# Patient Record
Sex: Female | Born: 1945 | Race: Asian | Hispanic: No | Smoking: Never smoker
Health system: Southern US, Community
[De-identification: ages and names within clinical notes are randomized; demographics above are authoritative.]

---

## 2002-09-18 ENCOUNTER — Other Ambulatory Visit: Admission: RE | Admit: 2002-09-18 | Discharge: 2002-09-18 | Payer: Self-pay | Admitting: Obstetrics and Gynecology

## 2002-10-10 ENCOUNTER — Encounter: Payer: Self-pay | Admitting: Urology

## 2002-10-10 ENCOUNTER — Encounter: Admission: RE | Admit: 2002-10-10 | Discharge: 2002-10-10 | Payer: Self-pay | Admitting: Urology

## 2005-09-04 ENCOUNTER — Other Ambulatory Visit: Admission: RE | Admit: 2005-09-04 | Discharge: 2005-09-04 | Payer: Self-pay | Admitting: Obstetrics and Gynecology

## 2014-12-16 DIAGNOSIS — I83811 Varicose veins of right lower extremities with pain: Secondary | ICD-10-CM | POA: Diagnosis not present

## 2014-12-16 DIAGNOSIS — Z09 Encounter for follow-up examination after completed treatment for conditions other than malignant neoplasm: Secondary | ICD-10-CM | POA: Diagnosis not present

## 2014-12-16 DIAGNOSIS — Z9889 Other specified postprocedural states: Secondary | ICD-10-CM | POA: Diagnosis not present

## 2015-02-11 DIAGNOSIS — K589 Irritable bowel syndrome without diarrhea: Secondary | ICD-10-CM | POA: Diagnosis not present

## 2015-02-11 DIAGNOSIS — J309 Allergic rhinitis, unspecified: Secondary | ICD-10-CM | POA: Diagnosis not present

## 2015-02-11 DIAGNOSIS — M159 Polyosteoarthritis, unspecified: Secondary | ICD-10-CM | POA: Diagnosis not present

## 2015-02-11 DIAGNOSIS — J209 Acute bronchitis, unspecified: Secondary | ICD-10-CM | POA: Diagnosis not present

## 2015-02-11 DIAGNOSIS — K5900 Constipation, unspecified: Secondary | ICD-10-CM | POA: Diagnosis not present

## 2015-03-17 DIAGNOSIS — I83891 Varicose veins of right lower extremities with other complications: Secondary | ICD-10-CM | POA: Diagnosis not present

## 2015-05-21 DIAGNOSIS — I872 Venous insufficiency (chronic) (peripheral): Secondary | ICD-10-CM | POA: Diagnosis not present

## 2015-07-02 DIAGNOSIS — R600 Localized edema: Secondary | ICD-10-CM | POA: Diagnosis not present

## 2015-09-01 DIAGNOSIS — G2581 Restless legs syndrome: Secondary | ICD-10-CM | POA: Diagnosis not present

## 2015-09-01 DIAGNOSIS — I10 Essential (primary) hypertension: Secondary | ICD-10-CM | POA: Diagnosis not present

## 2015-09-01 DIAGNOSIS — E785 Hyperlipidemia, unspecified: Secondary | ICD-10-CM | POA: Diagnosis not present

## 2015-09-01 DIAGNOSIS — M7989 Other specified soft tissue disorders: Secondary | ICD-10-CM | POA: Diagnosis not present

## 2015-11-12 DIAGNOSIS — J209 Acute bronchitis, unspecified: Secondary | ICD-10-CM | POA: Diagnosis not present

## 2015-11-22 DIAGNOSIS — J209 Acute bronchitis, unspecified: Secondary | ICD-10-CM | POA: Diagnosis not present

## 2015-12-08 DIAGNOSIS — Z0000 Encounter for general adult medical examination without abnormal findings: Secondary | ICD-10-CM | POA: Diagnosis not present

## 2015-12-08 DIAGNOSIS — Z23 Encounter for immunization: Secondary | ICD-10-CM | POA: Diagnosis not present

## 2015-12-08 DIAGNOSIS — E7800 Pure hypercholesterolemia, unspecified: Secondary | ICD-10-CM | POA: Diagnosis not present

## 2015-12-08 DIAGNOSIS — I10 Essential (primary) hypertension: Secondary | ICD-10-CM | POA: Diagnosis not present

## 2015-12-08 DIAGNOSIS — Z1389 Encounter for screening for other disorder: Secondary | ICD-10-CM | POA: Diagnosis not present

## 2015-12-08 DIAGNOSIS — G2581 Restless legs syndrome: Secondary | ICD-10-CM | POA: Diagnosis not present

## 2016-02-10 DIAGNOSIS — J302 Other seasonal allergic rhinitis: Secondary | ICD-10-CM | POA: Diagnosis not present

## 2016-02-25 DIAGNOSIS — R1310 Dysphagia, unspecified: Secondary | ICD-10-CM | POA: Diagnosis not present

## 2016-03-16 DIAGNOSIS — R1310 Dysphagia, unspecified: Secondary | ICD-10-CM | POA: Diagnosis not present

## 2016-03-20 DIAGNOSIS — R1310 Dysphagia, unspecified: Secondary | ICD-10-CM | POA: Diagnosis not present

## 2016-03-20 DIAGNOSIS — B9681 Helicobacter pylori [H. pylori] as the cause of diseases classified elsewhere: Secondary | ICD-10-CM | POA: Diagnosis not present

## 2016-03-20 DIAGNOSIS — K2950 Unspecified chronic gastritis without bleeding: Secondary | ICD-10-CM | POA: Diagnosis not present

## 2016-03-20 DIAGNOSIS — K253 Acute gastric ulcer without hemorrhage or perforation: Secondary | ICD-10-CM | POA: Diagnosis not present

## 2016-03-20 DIAGNOSIS — K2960 Other gastritis without bleeding: Secondary | ICD-10-CM | POA: Diagnosis not present

## 2016-03-20 DIAGNOSIS — Z791 Long term (current) use of non-steroidal anti-inflammatories (NSAID): Secondary | ICD-10-CM | POA: Diagnosis not present

## 2016-03-20 DIAGNOSIS — K219 Gastro-esophageal reflux disease without esophagitis: Secondary | ICD-10-CM | POA: Diagnosis not present

## 2016-03-24 DIAGNOSIS — R1310 Dysphagia, unspecified: Secondary | ICD-10-CM | POA: Diagnosis not present

## 2016-03-24 DIAGNOSIS — J302 Other seasonal allergic rhinitis: Secondary | ICD-10-CM | POA: Diagnosis not present

## 2016-04-20 DIAGNOSIS — K259 Gastric ulcer, unspecified as acute or chronic, without hemorrhage or perforation: Secondary | ICD-10-CM | POA: Diagnosis not present

## 2016-04-20 DIAGNOSIS — A048 Other specified bacterial intestinal infections: Secondary | ICD-10-CM | POA: Diagnosis not present

## 2016-04-20 DIAGNOSIS — R130 Aphagia: Secondary | ICD-10-CM | POA: Diagnosis not present

## 2016-06-30 DIAGNOSIS — K219 Gastro-esophageal reflux disease without esophagitis: Secondary | ICD-10-CM | POA: Diagnosis not present

## 2016-06-30 DIAGNOSIS — I10 Essential (primary) hypertension: Secondary | ICD-10-CM | POA: Diagnosis not present

## 2016-08-22 DIAGNOSIS — Z1231 Encounter for screening mammogram for malignant neoplasm of breast: Secondary | ICD-10-CM | POA: Diagnosis not present

## 2016-10-04 DIAGNOSIS — J329 Chronic sinusitis, unspecified: Secondary | ICD-10-CM | POA: Diagnosis not present

## 2016-10-04 DIAGNOSIS — J309 Allergic rhinitis, unspecified: Secondary | ICD-10-CM | POA: Diagnosis not present

## 2016-10-04 DIAGNOSIS — R070 Pain in throat: Secondary | ICD-10-CM | POA: Diagnosis not present

## 2016-10-04 DIAGNOSIS — R05 Cough: Secondary | ICD-10-CM | POA: Diagnosis not present

## 2016-10-09 DIAGNOSIS — J209 Acute bronchitis, unspecified: Secondary | ICD-10-CM | POA: Diagnosis not present

## 2016-11-08 DIAGNOSIS — I10 Essential (primary) hypertension: Secondary | ICD-10-CM | POA: Diagnosis not present

## 2016-11-08 DIAGNOSIS — R05 Cough: Secondary | ICD-10-CM | POA: Diagnosis not present

## 2016-11-13 DIAGNOSIS — R918 Other nonspecific abnormal finding of lung field: Secondary | ICD-10-CM | POA: Diagnosis not present

## 2016-11-13 DIAGNOSIS — R911 Solitary pulmonary nodule: Secondary | ICD-10-CM | POA: Diagnosis not present

## 2016-11-14 ENCOUNTER — Other Ambulatory Visit (HOSPITAL_COMMUNITY): Payer: Self-pay | Admitting: Internal Medicine

## 2016-11-14 DIAGNOSIS — D381 Neoplasm of uncertain behavior of trachea, bronchus and lung: Secondary | ICD-10-CM

## 2016-11-17 ENCOUNTER — Ambulatory Visit (INDEPENDENT_AMBULATORY_CARE_PROVIDER_SITE_OTHER): Payer: Medicare Other | Admitting: Internal Medicine

## 2016-11-17 ENCOUNTER — Encounter: Payer: Self-pay | Admitting: Internal Medicine

## 2016-11-17 VITALS — BP 122/66 | HR 69 | Ht 61.0 in | Wt 139.0 lb

## 2016-11-17 DIAGNOSIS — R911 Solitary pulmonary nodule: Secondary | ICD-10-CM
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
## 2016-11-17 NOTE — Patient Instructions (Addendum)
I will call you with the PET scan results - if it is suggestive of a tumor it needs to be removed and I will refer you to a thoracic surgeon - if not we will set up follow up   If there is a cxr at Dr Marguerita Beards office I would be happy to review it when you return.
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
## 2016-11-20 DIAGNOSIS — R490 Dysphonia: Secondary | ICD-10-CM | POA: Diagnosis not present

## 2016-11-20 DIAGNOSIS — R05 Cough: Secondary | ICD-10-CM | POA: Diagnosis not present

## 2016-11-20 DIAGNOSIS — J383 Other diseases of vocal cords: Secondary | ICD-10-CM | POA: Diagnosis not present

## 2016-11-23 ENCOUNTER — Ambulatory Visit (HOSPITAL_COMMUNITY)
Admission: RE | Admit: 2016-11-23 | Discharge: 2016-11-23 | Disposition: A | Discharge status: Home / Self Care | Payer: Medicare Other | Source: Ambulatory Visit | Attending: Internal Medicine | Admitting: Internal Medicine

## 2016-11-23 DIAGNOSIS — D381 Neoplasm of uncertain behavior of trachea, bronchus and lung: Secondary | ICD-10-CM | POA: Diagnosis not present

## 2016-11-23 DIAGNOSIS — R911 Solitary pulmonary nodule: Secondary | ICD-10-CM | POA: Diagnosis not present

## 2016-11-23 LAB — GLUCOSE, CAPILLARY: Glucose-Capillary: 96 mg/dL (ref 65–99)

## 2016-11-23 MED ORDER — FLUDEOXYGLUCOSE F - 18 (FDG) INJECTION
8.1000 | Freq: Once | INTRAVENOUS | Status: AC | PRN
  Administered 2016-11-23: 8.1 via INTRAVENOUS

## 2016-12-14 ENCOUNTER — Telehealth: Payer: Self-pay | Admitting: Internal Medicine

## 2016-12-14 NOTE — Telephone Encounter (Signed)
LMTCB

## 2016-12-15 NOTE — Telephone Encounter (Signed)
Spoke with pt's son, Gwyndolyn Saxon. He is wanting a copy of the pt's PET scan sent to her PCP and her ENT at [facility]. After doing some research, it looks like we were not the ones who ordered this PET scan. Pt's son states that her PCP did. Advised him that he would need to contact pt's PCP about this matter. Nothing further was needed.

## 2016-12-28 DIAGNOSIS — R109 Unspecified abdominal pain: Secondary | ICD-10-CM | POA: Diagnosis not present

## 2016-12-28 DIAGNOSIS — R911 Solitary pulmonary nodule: Secondary | ICD-10-CM | POA: Diagnosis not present

## 2017-01-05 DIAGNOSIS — R911 Solitary pulmonary nodule: Secondary | ICD-10-CM | POA: Diagnosis not present

## 2017-01-05 DIAGNOSIS — R918 Other nonspecific abnormal finding of lung field: Secondary | ICD-10-CM | POA: Diagnosis not present

## 2017-01-05 DIAGNOSIS — Z888 Allergy status to other drugs, medicaments and biological substances status: Secondary | ICD-10-CM | POA: Diagnosis not present

## 2017-01-05 DIAGNOSIS — Z79899 Other long term (current) drug therapy: Secondary | ICD-10-CM | POA: Diagnosis not present

## 2017-01-05 DIAGNOSIS — J42 Unspecified chronic bronchitis: Secondary | ICD-10-CM | POA: Diagnosis not present

## 2017-01-05 DIAGNOSIS — I10 Essential (primary) hypertension: Secondary | ICD-10-CM | POA: Diagnosis not present

## 2017-01-12 DIAGNOSIS — R112 Nausea with vomiting, unspecified: Secondary | ICD-10-CM | POA: Diagnosis not present

## 2017-01-18 DIAGNOSIS — K224 Dyskinesia of esophagus: Secondary | ICD-10-CM | POA: Diagnosis not present

## 2017-01-18 DIAGNOSIS — R1013 Epigastric pain: Secondary | ICD-10-CM | POA: Diagnosis not present

## 2017-01-18 DIAGNOSIS — A048 Other specified bacterial intestinal infections: Secondary | ICD-10-CM | POA: Diagnosis not present

## 2017-01-18 DIAGNOSIS — Z8711 Personal history of peptic ulcer disease: Secondary | ICD-10-CM | POA: Diagnosis not present

## 2017-01-18 DIAGNOSIS — R1110 Vomiting, unspecified: Secondary | ICD-10-CM | POA: Diagnosis not present

## 2017-01-23 DIAGNOSIS — N200 Calculus of kidney: Secondary | ICD-10-CM | POA: Diagnosis not present

## 2017-01-23 DIAGNOSIS — R1013 Epigastric pain: Secondary | ICD-10-CM | POA: Diagnosis not present

## 2017-01-23 DIAGNOSIS — K8020 Calculus of gallbladder without cholecystitis without obstruction: Secondary | ICD-10-CM | POA: Diagnosis not present

## 2017-01-23 DIAGNOSIS — A048 Other specified bacterial intestinal infections: Secondary | ICD-10-CM | POA: Diagnosis not present

## 2017-02-08 DIAGNOSIS — I2510 Atherosclerotic heart disease of native coronary artery without angina pectoris: Secondary | ICD-10-CM | POA: Diagnosis not present

## 2017-02-08 DIAGNOSIS — R590 Localized enlarged lymph nodes: Secondary | ICD-10-CM | POA: Diagnosis not present

## 2017-02-08 DIAGNOSIS — R911 Solitary pulmonary nodule: Secondary | ICD-10-CM | POA: Diagnosis not present

## 2017-02-27 DIAGNOSIS — J029 Acute pharyngitis, unspecified: Secondary | ICD-10-CM | POA: Diagnosis not present

## 2017-02-27 DIAGNOSIS — K8020 Calculus of gallbladder without cholecystitis without obstruction: Secondary | ICD-10-CM | POA: Diagnosis not present

## 2017-02-27 DIAGNOSIS — J310 Chronic rhinitis: Secondary | ICD-10-CM | POA: Diagnosis not present

## 2017-04-23 DIAGNOSIS — R1310 Dysphagia, unspecified: Secondary | ICD-10-CM | POA: Diagnosis not present

## 2017-04-23 DIAGNOSIS — E7800 Pure hypercholesterolemia, unspecified: Secondary | ICD-10-CM | POA: Diagnosis not present

## 2017-04-23 DIAGNOSIS — K8020 Calculus of gallbladder without cholecystitis without obstruction: Secondary | ICD-10-CM | POA: Diagnosis not present

## 2017-04-23 DIAGNOSIS — R918 Other nonspecific abnormal finding of lung field: Secondary | ICD-10-CM | POA: Diagnosis not present

## 2017-04-23 DIAGNOSIS — Z79899 Other long term (current) drug therapy: Secondary | ICD-10-CM | POA: Diagnosis not present

## 2017-04-23 DIAGNOSIS — I10 Essential (primary) hypertension: Secondary | ICD-10-CM | POA: Diagnosis not present

## 2017-05-09 DIAGNOSIS — R1310 Dysphagia, unspecified: Secondary | ICD-10-CM | POA: Diagnosis not present

## 2017-05-09 DIAGNOSIS — K219 Gastro-esophageal reflux disease without esophagitis: Secondary | ICD-10-CM | POA: Diagnosis not present

## 2017-05-14 DIAGNOSIS — R911 Solitary pulmonary nodule: Secondary | ICD-10-CM | POA: Diagnosis not present

## 2017-05-22 DIAGNOSIS — C3411 Malignant neoplasm of upper lobe, right bronchus or lung: Secondary | ICD-10-CM | POA: Diagnosis not present

## 2017-05-22 DIAGNOSIS — R911 Solitary pulmonary nodule: Secondary | ICD-10-CM | POA: Diagnosis not present

## 2017-05-24 DIAGNOSIS — R911 Solitary pulmonary nodule: Secondary | ICD-10-CM | POA: Diagnosis not present

## 2017-05-24 DIAGNOSIS — C801 Malignant (primary) neoplasm, unspecified: Secondary | ICD-10-CM | POA: Diagnosis not present

## 2017-05-28 DIAGNOSIS — Z7722 Contact with and (suspected) exposure to environmental tobacco smoke (acute) (chronic): Secondary | ICD-10-CM | POA: Diagnosis not present

## 2017-05-28 DIAGNOSIS — E7800 Pure hypercholesterolemia, unspecified: Secondary | ICD-10-CM | POA: Diagnosis not present

## 2017-05-28 DIAGNOSIS — C3490 Malignant neoplasm of unspecified part of unspecified bronchus or lung: Secondary | ICD-10-CM | POA: Diagnosis not present

## 2017-05-28 DIAGNOSIS — K210 Gastro-esophageal reflux disease with esophagitis: Secondary | ICD-10-CM | POA: Diagnosis not present

## 2017-05-28 DIAGNOSIS — Z886 Allergy status to analgesic agent status: Secondary | ICD-10-CM | POA: Diagnosis not present

## 2017-05-28 DIAGNOSIS — Z79899 Other long term (current) drug therapy: Secondary | ICD-10-CM | POA: Diagnosis not present

## 2017-05-28 DIAGNOSIS — I10 Essential (primary) hypertension: Secondary | ICD-10-CM | POA: Diagnosis not present

## 2017-05-28 DIAGNOSIS — R1310 Dysphagia, unspecified: Secondary | ICD-10-CM | POA: Diagnosis not present

## 2017-05-28 DIAGNOSIS — Z888 Allergy status to other drugs, medicaments and biological substances status: Secondary | ICD-10-CM | POA: Diagnosis not present

## 2017-06-12 DIAGNOSIS — C3411 Malignant neoplasm of upper lobe, right bronchus or lung: Secondary | ICD-10-CM | POA: Diagnosis not present

## 2017-06-12 DIAGNOSIS — K220 Achalasia of cardia: Secondary | ICD-10-CM | POA: Diagnosis not present

## 2017-06-29 DIAGNOSIS — R590 Localized enlarged lymph nodes: Secondary | ICD-10-CM | POA: Diagnosis not present

## 2017-06-29 DIAGNOSIS — C3411 Malignant neoplasm of upper lobe, right bronchus or lung: Secondary | ICD-10-CM | POA: Diagnosis not present

## 2017-06-29 DIAGNOSIS — E041 Nontoxic single thyroid nodule: Secondary | ICD-10-CM | POA: Diagnosis not present

## 2017-06-29 DIAGNOSIS — E279 Disorder of adrenal gland, unspecified: Secondary | ICD-10-CM | POA: Diagnosis not present

## 2017-07-16 DIAGNOSIS — I10 Essential (primary) hypertension: Secondary | ICD-10-CM | POA: Diagnosis not present

## 2017-07-16 DIAGNOSIS — I872 Venous insufficiency (chronic) (peripheral): Secondary | ICD-10-CM | POA: Diagnosis not present

## 2017-07-20 DIAGNOSIS — Z9842 Cataract extraction status, left eye: Secondary | ICD-10-CM | POA: Diagnosis not present

## 2017-07-20 DIAGNOSIS — E041 Nontoxic single thyroid nodule: Secondary | ICD-10-CM | POA: Diagnosis not present

## 2017-07-20 DIAGNOSIS — K219 Gastro-esophageal reflux disease without esophagitis: Secondary | ICD-10-CM | POA: Diagnosis not present

## 2017-07-20 DIAGNOSIS — Z9841 Cataract extraction status, right eye: Secondary | ICD-10-CM | POA: Diagnosis not present

## 2017-07-20 DIAGNOSIS — R197 Diarrhea, unspecified: Secondary | ICD-10-CM | POA: Diagnosis not present

## 2017-07-20 DIAGNOSIS — I700 Atherosclerosis of aorta: Secondary | ICD-10-CM | POA: Diagnosis not present

## 2017-07-20 DIAGNOSIS — I10 Essential (primary) hypertension: Secondary | ICD-10-CM | POA: Diagnosis not present

## 2017-07-20 DIAGNOSIS — J939 Pneumothorax, unspecified: Secondary | ICD-10-CM | POA: Diagnosis not present

## 2017-07-20 DIAGNOSIS — G2581 Restless legs syndrome: Secondary | ICD-10-CM | POA: Diagnosis not present

## 2017-07-20 DIAGNOSIS — Z01818 Encounter for other preprocedural examination: Secondary | ICD-10-CM | POA: Diagnosis not present

## 2017-07-20 DIAGNOSIS — E785 Hyperlipidemia, unspecified: Secondary | ICD-10-CM | POA: Diagnosis not present

## 2017-07-20 DIAGNOSIS — Z79899 Other long term (current) drug therapy: Secondary | ICD-10-CM | POA: Diagnosis not present

## 2017-07-20 DIAGNOSIS — R911 Solitary pulmonary nodule: Secondary | ICD-10-CM | POA: Diagnosis not present

## 2017-07-20 DIAGNOSIS — C3411 Malignant neoplasm of upper lobe, right bronchus or lung: Secondary | ICD-10-CM | POA: Diagnosis not present

## 2017-07-23 DIAGNOSIS — C3490 Malignant neoplasm of unspecified part of unspecified bronchus or lung: Secondary | ICD-10-CM | POA: Diagnosis not present

## 2017-07-23 DIAGNOSIS — C3411 Malignant neoplasm of upper lobe, right bronchus or lung: Secondary | ICD-10-CM | POA: Diagnosis not present

## 2017-07-23 DIAGNOSIS — I491 Atrial premature depolarization: Secondary | ICD-10-CM | POA: Diagnosis not present

## 2017-07-23 DIAGNOSIS — Z01818 Encounter for other preprocedural examination: Secondary | ICD-10-CM | POA: Diagnosis not present

## 2017-07-26 DIAGNOSIS — Z01818 Encounter for other preprocedural examination: Secondary | ICD-10-CM | POA: Diagnosis not present

## 2017-07-26 DIAGNOSIS — C3490 Malignant neoplasm of unspecified part of unspecified bronchus or lung: Secondary | ICD-10-CM | POA: Diagnosis not present

## 2017-07-26 DIAGNOSIS — R9439 Abnormal result of other cardiovascular function study: Secondary | ICD-10-CM | POA: Diagnosis not present

## 2017-08-01 DIAGNOSIS — T797XXA Traumatic subcutaneous emphysema, initial encounter: Secondary | ICD-10-CM | POA: Diagnosis not present

## 2017-08-01 DIAGNOSIS — R0602 Shortness of breath: Secondary | ICD-10-CM | POA: Diagnosis not present

## 2017-08-01 DIAGNOSIS — Z4682 Encounter for fitting and adjustment of non-vascular catheter: Secondary | ICD-10-CM | POA: Diagnosis not present

## 2017-08-01 DIAGNOSIS — Z9841 Cataract extraction status, right eye: Secondary | ICD-10-CM | POA: Diagnosis not present

## 2017-08-01 DIAGNOSIS — G2581 Restless legs syndrome: Secondary | ICD-10-CM | POA: Diagnosis not present

## 2017-08-01 DIAGNOSIS — Z902 Acquired absence of lung [part of]: Secondary | ICD-10-CM | POA: Diagnosis not present

## 2017-08-01 DIAGNOSIS — Z9842 Cataract extraction status, left eye: Secondary | ICD-10-CM | POA: Diagnosis not present

## 2017-08-01 DIAGNOSIS — I10 Essential (primary) hypertension: Secondary | ICD-10-CM | POA: Diagnosis not present

## 2017-08-01 DIAGNOSIS — K219 Gastro-esophageal reflux disease without esophagitis: Secondary | ICD-10-CM | POA: Diagnosis not present

## 2017-08-01 DIAGNOSIS — J939 Pneumothorax, unspecified: Secondary | ICD-10-CM | POA: Diagnosis not present

## 2017-08-01 DIAGNOSIS — C3491 Malignant neoplasm of unspecified part of right bronchus or lung: Secondary | ICD-10-CM | POA: Diagnosis not present

## 2017-08-01 DIAGNOSIS — J948 Other specified pleural conditions: Secondary | ICD-10-CM | POA: Diagnosis not present

## 2017-08-01 DIAGNOSIS — Z79899 Other long term (current) drug therapy: Secondary | ICD-10-CM | POA: Diagnosis not present

## 2017-08-01 DIAGNOSIS — R197 Diarrhea, unspecified: Secondary | ICD-10-CM | POA: Diagnosis not present

## 2017-08-01 DIAGNOSIS — C3411 Malignant neoplasm of upper lobe, right bronchus or lung: Secondary | ICD-10-CM | POA: Diagnosis not present

## 2017-08-01 DIAGNOSIS — E785 Hyperlipidemia, unspecified: Secondary | ICD-10-CM | POA: Diagnosis not present

## 2017-08-08 DIAGNOSIS — I10 Essential (primary) hypertension: Secondary | ICD-10-CM | POA: Diagnosis not present

## 2017-08-08 DIAGNOSIS — R05 Cough: Secondary | ICD-10-CM | POA: Diagnosis not present

## 2017-08-08 DIAGNOSIS — K5900 Constipation, unspecified: Secondary | ICD-10-CM | POA: Diagnosis not present

## 2017-08-08 DIAGNOSIS — R9431 Abnormal electrocardiogram [ECG] [EKG]: Secondary | ICD-10-CM | POA: Diagnosis not present

## 2017-08-20 DIAGNOSIS — J302 Other seasonal allergic rhinitis: Secondary | ICD-10-CM | POA: Diagnosis not present

## 2017-08-31 DIAGNOSIS — Z483 Aftercare following surgery for neoplasm: Secondary | ICD-10-CM | POA: Diagnosis not present

## 2017-08-31 DIAGNOSIS — J948 Other specified pleural conditions: Secondary | ICD-10-CM | POA: Diagnosis not present

## 2017-08-31 DIAGNOSIS — C3411 Malignant neoplasm of upper lobe, right bronchus or lung: Secondary | ICD-10-CM | POA: Diagnosis not present

## 2017-08-31 DIAGNOSIS — Z902 Acquired absence of lung [part of]: Secondary | ICD-10-CM | POA: Diagnosis not present

## 2017-09-06 DIAGNOSIS — C3411 Malignant neoplasm of upper lobe, right bronchus or lung: Secondary | ICD-10-CM | POA: Diagnosis not present

## 2017-09-14 DIAGNOSIS — J948 Other specified pleural conditions: Secondary | ICD-10-CM | POA: Diagnosis not present

## 2017-09-14 DIAGNOSIS — Z902 Acquired absence of lung [part of]: Secondary | ICD-10-CM | POA: Diagnosis not present

## 2017-09-14 DIAGNOSIS — Z85118 Personal history of other malignant neoplasm of bronchus and lung: Secondary | ICD-10-CM | POA: Diagnosis not present

## 2017-09-14 DIAGNOSIS — R05 Cough: Secondary | ICD-10-CM | POA: Diagnosis not present

## 2017-09-14 DIAGNOSIS — Z23 Encounter for immunization: Secondary | ICD-10-CM | POA: Diagnosis not present

## 2017-09-25 DIAGNOSIS — K219 Gastro-esophageal reflux disease without esophagitis: Secondary | ICD-10-CM | POA: Diagnosis not present

## 2017-09-25 DIAGNOSIS — R1310 Dysphagia, unspecified: Secondary | ICD-10-CM | POA: Diagnosis not present

## 2017-09-25 DIAGNOSIS — Z79899 Other long term (current) drug therapy: Secondary | ICD-10-CM | POA: Diagnosis not present

## 2017-09-25 DIAGNOSIS — Z886 Allergy status to analgesic agent status: Secondary | ICD-10-CM | POA: Diagnosis not present

## 2017-09-25 DIAGNOSIS — I10 Essential (primary) hypertension: Secondary | ICD-10-CM | POA: Diagnosis not present

## 2017-09-25 DIAGNOSIS — K8050 Calculus of bile duct without cholangitis or cholecystitis without obstruction: Secondary | ICD-10-CM | POA: Diagnosis not present

## 2017-09-25 DIAGNOSIS — K220 Achalasia of cardia: Secondary | ICD-10-CM | POA: Diagnosis not present

## 2017-09-25 DIAGNOSIS — E7800 Pure hypercholesterolemia, unspecified: Secondary | ICD-10-CM | POA: Diagnosis not present

## 2017-09-25 DIAGNOSIS — Z888 Allergy status to other drugs, medicaments and biological substances status: Secondary | ICD-10-CM | POA: Diagnosis not present

## 2017-10-04 DIAGNOSIS — K220 Achalasia of cardia: Secondary | ICD-10-CM | POA: Diagnosis not present

## 2017-10-04 DIAGNOSIS — K8050 Calculus of bile duct without cholangitis or cholecystitis without obstruction: Secondary | ICD-10-CM | POA: Diagnosis not present

## 2017-10-04 DIAGNOSIS — Z902 Acquired absence of lung [part of]: Secondary | ICD-10-CM | POA: Diagnosis not present

## 2017-10-08 DIAGNOSIS — C3491 Malignant neoplasm of unspecified part of right bronchus or lung: Secondary | ICD-10-CM | POA: Diagnosis not present

## 2017-10-17 DIAGNOSIS — I10 Essential (primary) hypertension: Secondary | ICD-10-CM | POA: Diagnosis not present

## 2017-11-05 DIAGNOSIS — E041 Nontoxic single thyroid nodule: Secondary | ICD-10-CM | POA: Diagnosis not present

## 2017-11-05 DIAGNOSIS — R05 Cough: Secondary | ICD-10-CM | POA: Diagnosis not present

## 2017-11-05 DIAGNOSIS — C3490 Malignant neoplasm of unspecified part of unspecified bronchus or lung: Secondary | ICD-10-CM | POA: Diagnosis not present

## 2017-12-07 DIAGNOSIS — K220 Achalasia of cardia: Secondary | ICD-10-CM | POA: Diagnosis not present

## 2017-12-07 DIAGNOSIS — R1011 Right upper quadrant pain: Secondary | ICD-10-CM | POA: Diagnosis not present

## 2018-01-17 DIAGNOSIS — R05 Cough: Secondary | ICD-10-CM | POA: Diagnosis not present

## 2018-01-17 DIAGNOSIS — E7800 Pure hypercholesterolemia, unspecified: Secondary | ICD-10-CM | POA: Diagnosis not present

## 2018-01-17 DIAGNOSIS — M150 Primary generalized (osteo)arthritis: Secondary | ICD-10-CM | POA: Diagnosis not present

## 2018-01-17 DIAGNOSIS — I10 Essential (primary) hypertension: Secondary | ICD-10-CM | POA: Diagnosis not present

## 2018-01-17 DIAGNOSIS — Z0000 Encounter for general adult medical examination without abnormal findings: Secondary | ICD-10-CM | POA: Diagnosis not present

## 2018-01-17 DIAGNOSIS — Z1389 Encounter for screening for other disorder: Secondary | ICD-10-CM | POA: Diagnosis not present

## 2018-01-17 DIAGNOSIS — E559 Vitamin D deficiency, unspecified: Secondary | ICD-10-CM | POA: Diagnosis not present

## 2018-01-29 DIAGNOSIS — I10 Essential (primary) hypertension: Secondary | ICD-10-CM | POA: Diagnosis not present

## 2018-01-29 DIAGNOSIS — K8010 Calculus of gallbladder with chronic cholecystitis without obstruction: Secondary | ICD-10-CM | POA: Diagnosis not present

## 2018-01-29 DIAGNOSIS — K219 Gastro-esophageal reflux disease without esophagitis: Secondary | ICD-10-CM | POA: Diagnosis not present

## 2018-01-29 DIAGNOSIS — R1011 Right upper quadrant pain: Secondary | ICD-10-CM | POA: Diagnosis not present

## 2018-02-04 DIAGNOSIS — K8020 Calculus of gallbladder without cholecystitis without obstruction: Secondary | ICD-10-CM | POA: Diagnosis not present

## 2018-02-04 DIAGNOSIS — K8018 Calculus of gallbladder with other cholecystitis without obstruction: Secondary | ICD-10-CM | POA: Diagnosis not present

## 2018-02-04 DIAGNOSIS — K8010 Calculus of gallbladder with chronic cholecystitis without obstruction: Secondary | ICD-10-CM | POA: Diagnosis not present

## 2018-02-20 DIAGNOSIS — J302 Other seasonal allergic rhinitis: Secondary | ICD-10-CM | POA: Diagnosis not present

## 2018-02-20 DIAGNOSIS — J0100 Acute maxillary sinusitis, unspecified: Secondary | ICD-10-CM | POA: Diagnosis not present

## 2018-02-22 DIAGNOSIS — H5203 Hypermetropia, bilateral: Secondary | ICD-10-CM | POA: Diagnosis not present

## 2018-02-22 DIAGNOSIS — H43813 Vitreous degeneration, bilateral: Secondary | ICD-10-CM | POA: Diagnosis not present

## 2018-03-14 DIAGNOSIS — J302 Other seasonal allergic rhinitis: Secondary | ICD-10-CM | POA: Diagnosis not present

## 2018-03-14 DIAGNOSIS — J0100 Acute maxillary sinusitis, unspecified: Secondary | ICD-10-CM | POA: Diagnosis not present

## 2018-03-18 DIAGNOSIS — J0100 Acute maxillary sinusitis, unspecified: Secondary | ICD-10-CM | POA: Diagnosis not present

## 2018-03-26 DIAGNOSIS — Y95 Nosocomial condition: Secondary | ICD-10-CM | POA: Diagnosis not present

## 2018-03-26 DIAGNOSIS — J984 Other disorders of lung: Secondary | ICD-10-CM | POA: Diagnosis not present

## 2018-03-26 DIAGNOSIS — R1310 Dysphagia, unspecified: Secondary | ICD-10-CM | POA: Diagnosis not present

## 2018-03-26 DIAGNOSIS — E785 Hyperlipidemia, unspecified: Secondary | ICD-10-CM | POA: Diagnosis not present

## 2018-03-26 DIAGNOSIS — J189 Pneumonia, unspecified organism: Secondary | ICD-10-CM | POA: Diagnosis not present

## 2018-03-26 DIAGNOSIS — Z7951 Long term (current) use of inhaled steroids: Secondary | ICD-10-CM | POA: Diagnosis not present

## 2018-03-26 DIAGNOSIS — C3411 Malignant neoplasm of upper lobe, right bronchus or lung: Secondary | ICD-10-CM | POA: Diagnosis not present

## 2018-03-26 DIAGNOSIS — K224 Dyskinesia of esophagus: Secondary | ICD-10-CM | POA: Diagnosis not present

## 2018-03-26 DIAGNOSIS — R0602 Shortness of breath: Secondary | ICD-10-CM | POA: Diagnosis not present

## 2018-03-26 DIAGNOSIS — J45909 Unspecified asthma, uncomplicated: Secondary | ICD-10-CM | POA: Diagnosis not present

## 2018-03-26 DIAGNOSIS — A419 Sepsis, unspecified organism: Secondary | ICD-10-CM | POA: Diagnosis not present

## 2018-03-26 DIAGNOSIS — R6521 Severe sepsis with septic shock: Secondary | ICD-10-CM | POA: Diagnosis not present

## 2018-03-26 DIAGNOSIS — I10 Essential (primary) hypertension: Secondary | ICD-10-CM | POA: Diagnosis not present

## 2018-03-26 DIAGNOSIS — I5033 Acute on chronic diastolic (congestive) heart failure: Secondary | ICD-10-CM | POA: Diagnosis not present

## 2018-03-26 DIAGNOSIS — R1084 Generalized abdominal pain: Secondary | ICD-10-CM | POA: Diagnosis not present

## 2018-03-26 DIAGNOSIS — R933 Abnormal findings on diagnostic imaging of other parts of digestive tract: Secondary | ICD-10-CM | POA: Diagnosis not present

## 2018-03-26 DIAGNOSIS — J159 Unspecified bacterial pneumonia: Secondary | ICD-10-CM | POA: Diagnosis not present

## 2018-03-26 DIAGNOSIS — I110 Hypertensive heart disease with heart failure: Secondary | ICD-10-CM | POA: Diagnosis not present

## 2018-03-26 DIAGNOSIS — Z902 Acquired absence of lung [part of]: Secondary | ICD-10-CM | POA: Diagnosis not present

## 2018-03-26 DIAGNOSIS — J690 Pneumonitis due to inhalation of food and vomit: Secondary | ICD-10-CM | POA: Diagnosis not present

## 2018-03-26 DIAGNOSIS — Z85118 Personal history of other malignant neoplasm of bronchus and lung: Secondary | ICD-10-CM | POA: Diagnosis not present

## 2018-03-26 DIAGNOSIS — Z91018 Allergy to other foods: Secondary | ICD-10-CM | POA: Diagnosis not present

## 2018-03-26 DIAGNOSIS — J9601 Acute respiratory failure with hypoxia: Secondary | ICD-10-CM | POA: Diagnosis not present

## 2018-03-26 DIAGNOSIS — R112 Nausea with vomiting, unspecified: Secondary | ICD-10-CM | POA: Diagnosis not present

## 2018-03-26 DIAGNOSIS — Z888 Allergy status to other drugs, medicaments and biological substances status: Secondary | ICD-10-CM | POA: Diagnosis not present

## 2018-03-26 DIAGNOSIS — E041 Nontoxic single thyroid nodule: Secondary | ICD-10-CM | POA: Diagnosis not present

## 2018-03-26 DIAGNOSIS — R918 Other nonspecific abnormal finding of lung field: Secondary | ICD-10-CM | POA: Diagnosis not present

## 2018-03-26 DIAGNOSIS — R1319 Other dysphagia: Secondary | ICD-10-CM | POA: Diagnosis not present

## 2018-03-26 DIAGNOSIS — K220 Achalasia of cardia: Secondary | ICD-10-CM | POA: Diagnosis not present

## 2018-03-26 DIAGNOSIS — Z886 Allergy status to analgesic agent status: Secondary | ICD-10-CM | POA: Diagnosis not present

## 2018-03-26 DIAGNOSIS — C3491 Malignant neoplasm of unspecified part of right bronchus or lung: Secondary | ICD-10-CM | POA: Diagnosis not present

## 2018-03-26 DIAGNOSIS — K219 Gastro-esophageal reflux disease without esophagitis: Secondary | ICD-10-CM | POA: Diagnosis not present

## 2018-03-26 DIAGNOSIS — Z9049 Acquired absence of other specified parts of digestive tract: Secondary | ICD-10-CM | POA: Diagnosis not present

## 2018-04-09 DIAGNOSIS — R05 Cough: Secondary | ICD-10-CM | POA: Diagnosis not present

## 2018-04-15 DIAGNOSIS — E042 Nontoxic multinodular goiter: Secondary | ICD-10-CM | POA: Diagnosis not present

## 2018-04-15 DIAGNOSIS — Z9889 Other specified postprocedural states: Secondary | ICD-10-CM | POA: Diagnosis not present

## 2018-04-15 DIAGNOSIS — J690 Pneumonitis due to inhalation of food and vomit: Secondary | ICD-10-CM | POA: Diagnosis not present

## 2018-04-15 DIAGNOSIS — E041 Nontoxic single thyroid nodule: Secondary | ICD-10-CM | POA: Diagnosis not present

## 2018-04-15 DIAGNOSIS — C3411 Malignant neoplasm of upper lobe, right bronchus or lung: Secondary | ICD-10-CM | POA: Diagnosis not present

## 2018-04-23 DIAGNOSIS — J690 Pneumonitis due to inhalation of food and vomit: Secondary | ICD-10-CM | POA: Diagnosis not present

## 2018-04-23 DIAGNOSIS — Z902 Acquired absence of lung [part of]: Secondary | ICD-10-CM | POA: Diagnosis not present

## 2018-05-02 IMAGING — PT NM PET TUM IMG INITIAL (PI) SKULL BASE T - THIGH
1 of 8 series · 1 of 25 positions shown · non-contrast
Comparison: Chest CT 11/13/2016

CLINICAL DATA: Initial treatment strategy for lung mass.

EXAM:
NUCLEAR MEDICINE PET SKULL BASE TO THIGH
TECHNIQUE: 8.1 mCi F-18 FDG was injected intravenously. Full-ring PET imaging
was performed from the skull base to thigh after the radiotracer. CT
data was obtained and used for attenuation correction and anatomic
localization.
FASTING BLOOD GLUCOSE:  Value: 96 mg/dl

[Series 4: ct sk_thigh 5.0 b31f · axial · 5.0mm · 0.98mm/px · 1 of 205 slices shown]
[im 205/205  brain]
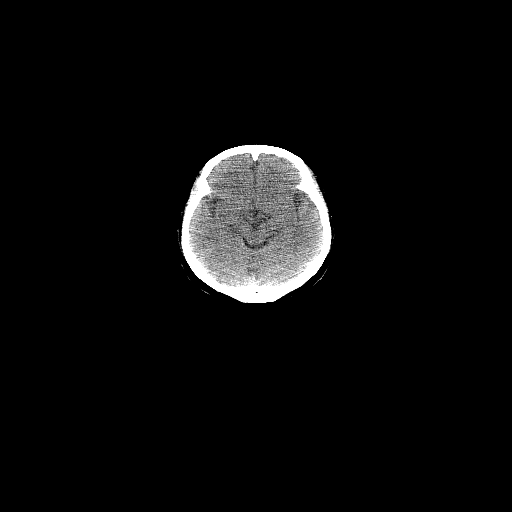

[1 of 25 positions shown; findings below may reference images not displayed]

FINDINGS: NECK

No hypermetabolic lymph nodes in the neck.

CHEST

19 mm lobular nodule identified in the posterior right upper lobe
shows FDG uptake with mild hypermetabolism ( SUV max = 2.9). Tiny
focus of slightly increased FDG uptake identified in the right
hilum, but no underlying lymphadenopathy can be appreciated on
today's study or the previous exam going to both of which were
performed without intravenous contrast material.

Low level FDG uptake is identified in normal sized axillary lymph
nodes bilaterally, likely reactive as this would be an atypical
presentation for metastatic disease in the absence of more definite
central metastatic deposits.

Heart size is mildly increased. Coronary artery calcification is
evident. There is atherosclerotic calcification in the wall of the
thoracic aorta.

ABDOMEN/PELVIS

No abnormal hypermetabolic activity within the liver, pancreas,
adrenal glands, or spleen. No hypermetabolic lymph nodes in the
abdomen or pelvis.

There is abdominal aortic atherosclerosis without aneurysm.

SKELETON

Marked hypermetabolic FDG uptake is identified in the right L5-S1
facet joint. There appears to be some erosive change associated with
the facet joint, but this is not well evaluated on today's exam.
IMPRESSION: 1. 1.9 cm posterior right upper lobe pulmonary nodule shows low
level FDG uptake. CT imaging features and a low level uptake are
suspicious for low-grade or well differentiated neoplasm.
Infectious/inflammatory etiology for this nodule is considered less
likely.
2. Low level FDG uptake in the right hilum at or just slightly above
background mediastinal levels without underlying lymphadenopathy
evident by CT imaging. This is nonspecific in not consider definite
for metastatic disease.
3. Marked hypermetabolism associated with the right L5-S1 facet
joint. This may be related to degenerative or inflammatory arthritis
given the apparent erosion at the joint space, but this area is not
well evaluated on axial CT imaging today obtained for attenuation
correction. Lumbar spine MRI without and with contrast could be used
to more definitively exclude metastatic involvement, as clinically
warranted.

## 2018-05-21 DIAGNOSIS — Z124 Encounter for screening for malignant neoplasm of cervix: Secondary | ICD-10-CM | POA: Diagnosis not present

## 2018-05-21 DIAGNOSIS — R300 Dysuria: Secondary | ICD-10-CM | POA: Diagnosis not present

## 2018-05-21 DIAGNOSIS — R9389 Abnormal findings on diagnostic imaging of other specified body structures: Secondary | ICD-10-CM | POA: Diagnosis not present
